# Patient Record
Sex: Female | Born: 1935 | Race: White | Hispanic: No
Health system: Southern US, Community
[De-identification: ages and names within clinical notes are randomized; demographics above are authoritative.]

---

## 2003-05-21 ENCOUNTER — Other Ambulatory Visit: Payer: Self-pay

## 2003-07-24 ENCOUNTER — Other Ambulatory Visit: Payer: Self-pay

## 2004-09-30 ENCOUNTER — Ambulatory Visit: Payer: Self-pay

## 2005-04-11 ENCOUNTER — Ambulatory Visit: Payer: Self-pay | Admitting: Internal Medicine

## 2005-04-27 ENCOUNTER — Other Ambulatory Visit: Payer: Self-pay

## 2005-05-05 ENCOUNTER — Inpatient Hospital Stay: Payer: Self-pay | Admitting: Unknown Physician Specialty

## 2005-05-11 ENCOUNTER — Emergency Department: Payer: Self-pay | Admitting: Emergency Medicine

## 2005-05-11 ENCOUNTER — Other Ambulatory Visit: Payer: Self-pay

## 2005-05-25 ENCOUNTER — Inpatient Hospital Stay: Payer: Self-pay | Admitting: Internal Medicine

## 2005-05-25 ENCOUNTER — Other Ambulatory Visit: Payer: Self-pay

## 2006-04-24 ENCOUNTER — Other Ambulatory Visit: Payer: Self-pay

## 2006-04-24 ENCOUNTER — Emergency Department: Payer: Self-pay | Admitting: Emergency Medicine

## 2007-06-11 ENCOUNTER — Ambulatory Visit: Payer: Self-pay

## 2007-07-01 ENCOUNTER — Other Ambulatory Visit: Payer: Self-pay

## 2007-07-01 ENCOUNTER — Inpatient Hospital Stay: Payer: Self-pay | Admitting: Internal Medicine

## 2007-07-05 ENCOUNTER — Other Ambulatory Visit: Payer: Self-pay

## 2007-09-22 ENCOUNTER — Emergency Department: Payer: Self-pay | Admitting: Emergency Medicine

## 2007-09-26 ENCOUNTER — Inpatient Hospital Stay: Payer: Self-pay | Admitting: Internal Medicine

## 2007-09-29 ENCOUNTER — Emergency Department: Payer: Self-pay | Admitting: Unknown Physician Specialty

## 2007-11-29 ENCOUNTER — Inpatient Hospital Stay: Payer: Self-pay | Admitting: Internal Medicine

## 2009-03-08 IMAGING — CR DG CHEST 1V PORT
1 series · 1 of 1 positions shown · non-contrast
Comparison: none

REASON FOR EXAM: COUGH
COMMENTS:

[view not recorded]
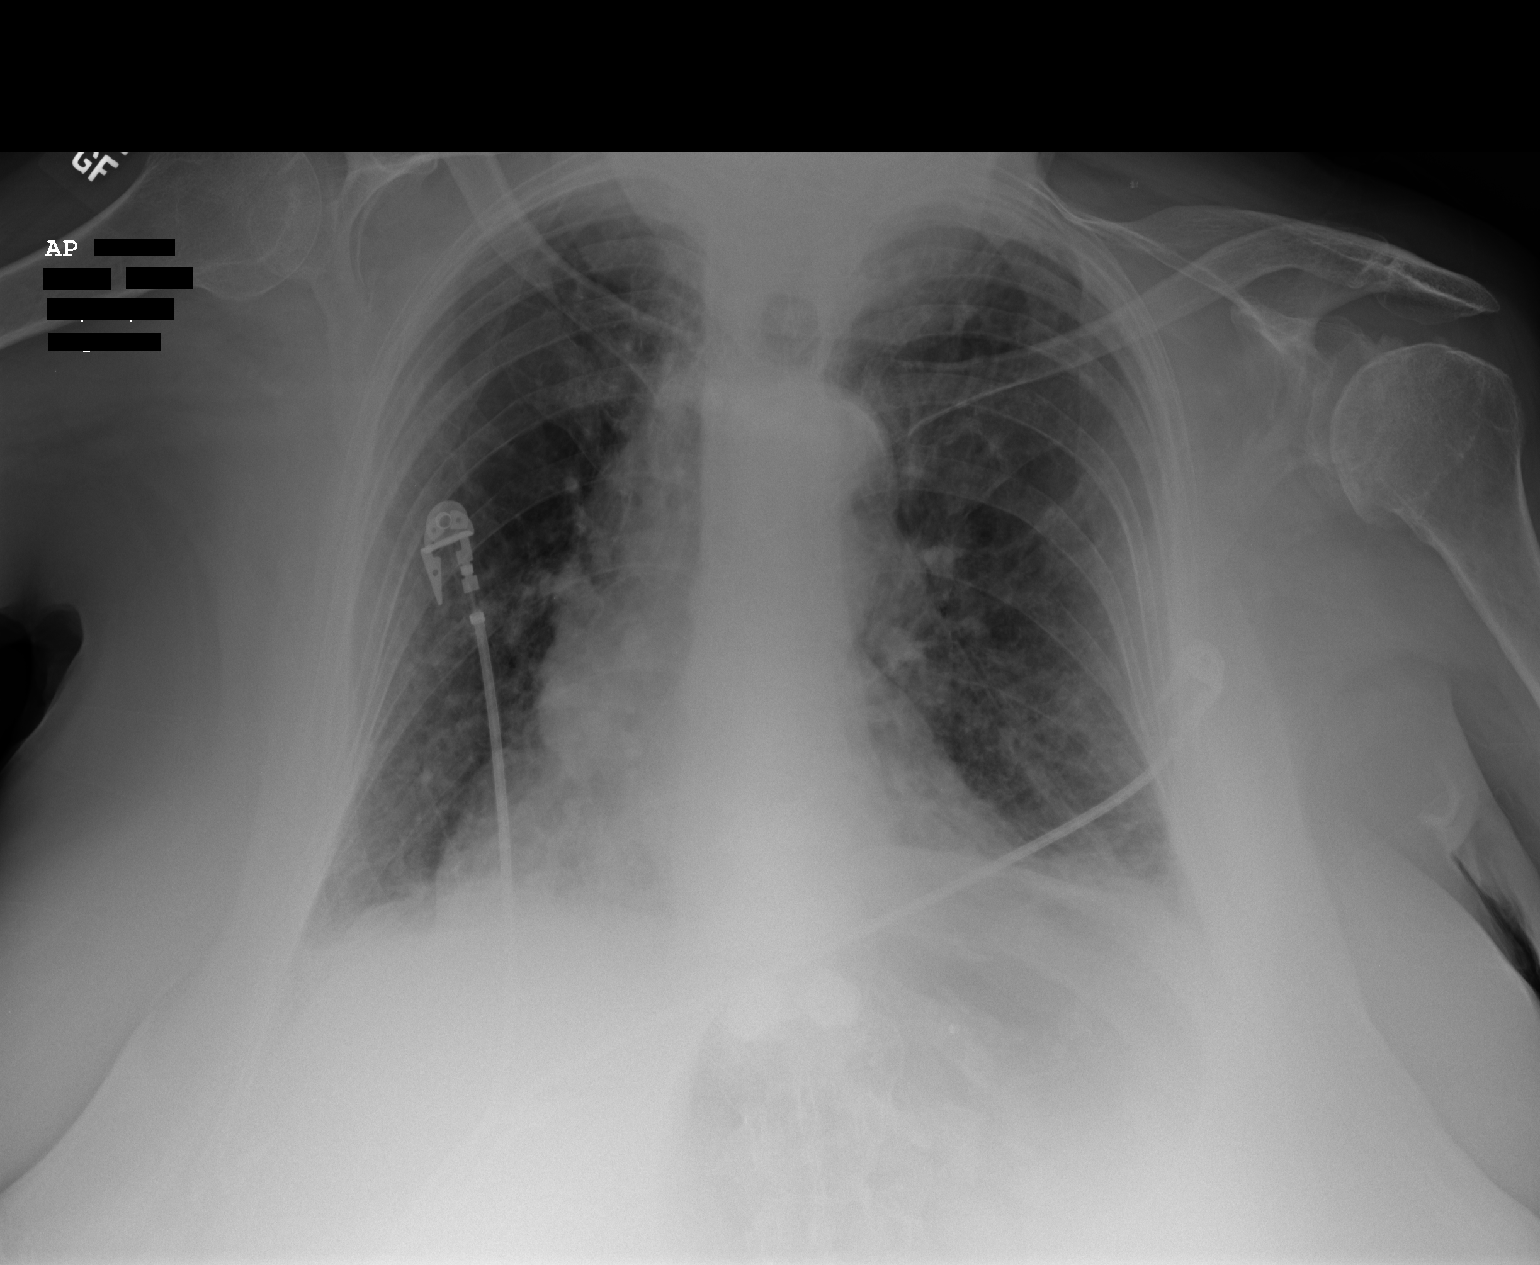

[1 of 1 positions shown; findings below may reference images not displayed]

PROCEDURE:     DXR - DXR PORTABLE CHEST SINGLE VIEW  - July 01, 2007  [DATE]

RESULT:     Comparison is made to study 24 April, 2006.

The lungs are adequately inflated. The interstitial markings are mildly
prominent but not significantly changed. The cardiac silhouette remains
enlarged. The pulmonary vascularity is not engorged. The bones are
osteopenic.
IMPRESSION: There are findings which may reflect an element of
underlying low grade CHF. I do not see evidence of pneumonia. There is no
significant interval change since the study [DATE].

## 2009-05-30 IMAGING — CR DG ABDOMEN 3V
1 series · 5 of 5 positions shown · non-contrast
Comparison: none

REASON FOR EXAM: Nausea, vomiting
COMMENTS:

[Series 1: view not recorded · 0.17mm/px · 5 of 5 slices shown]
[im 1/5]
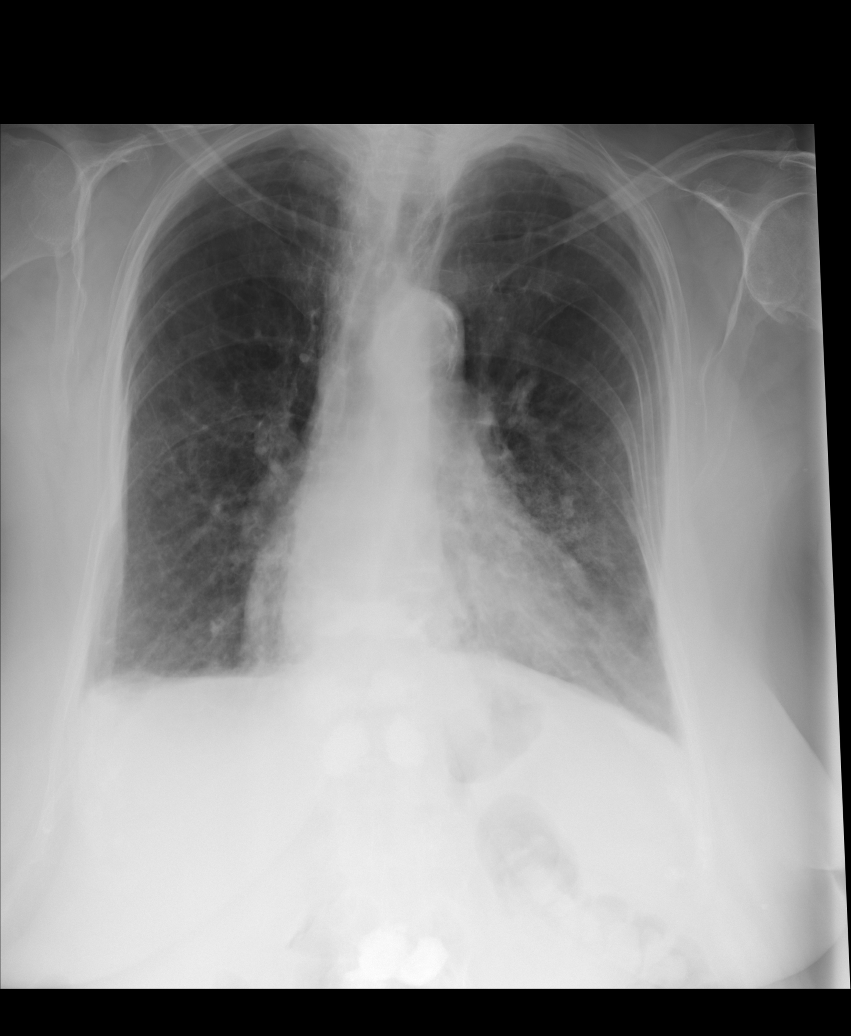
[im 2/5]
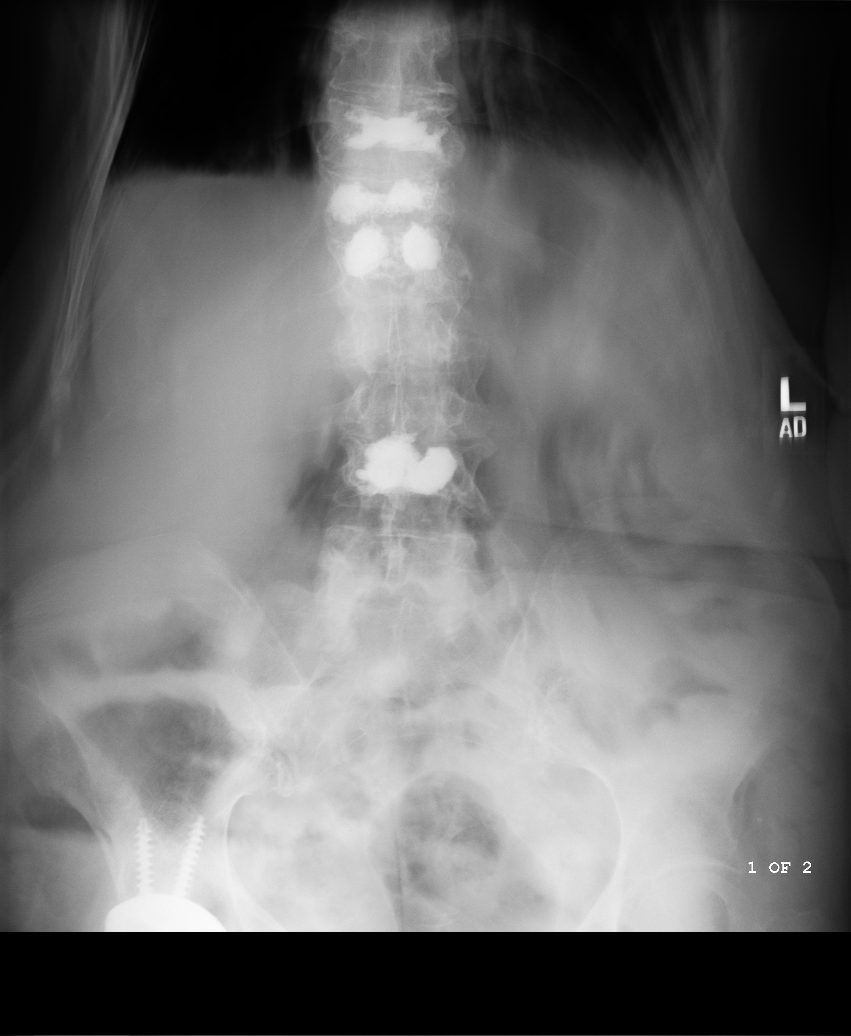
[im 3/5]
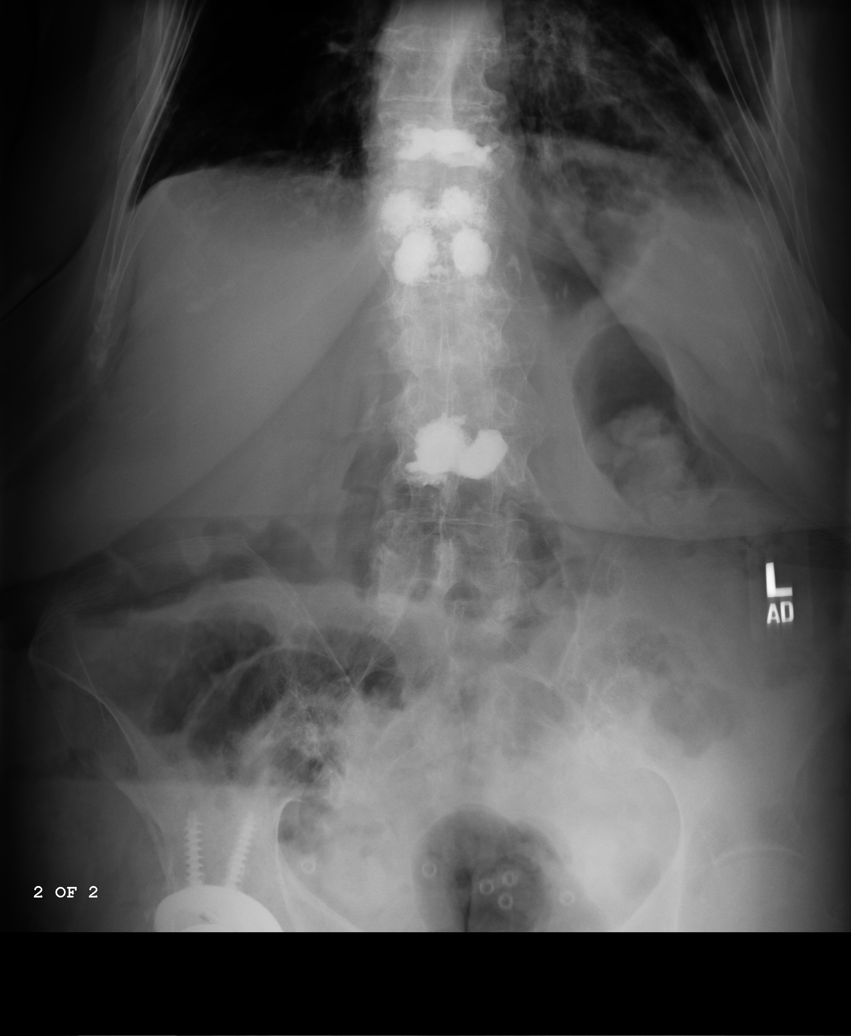
[im 4/5]
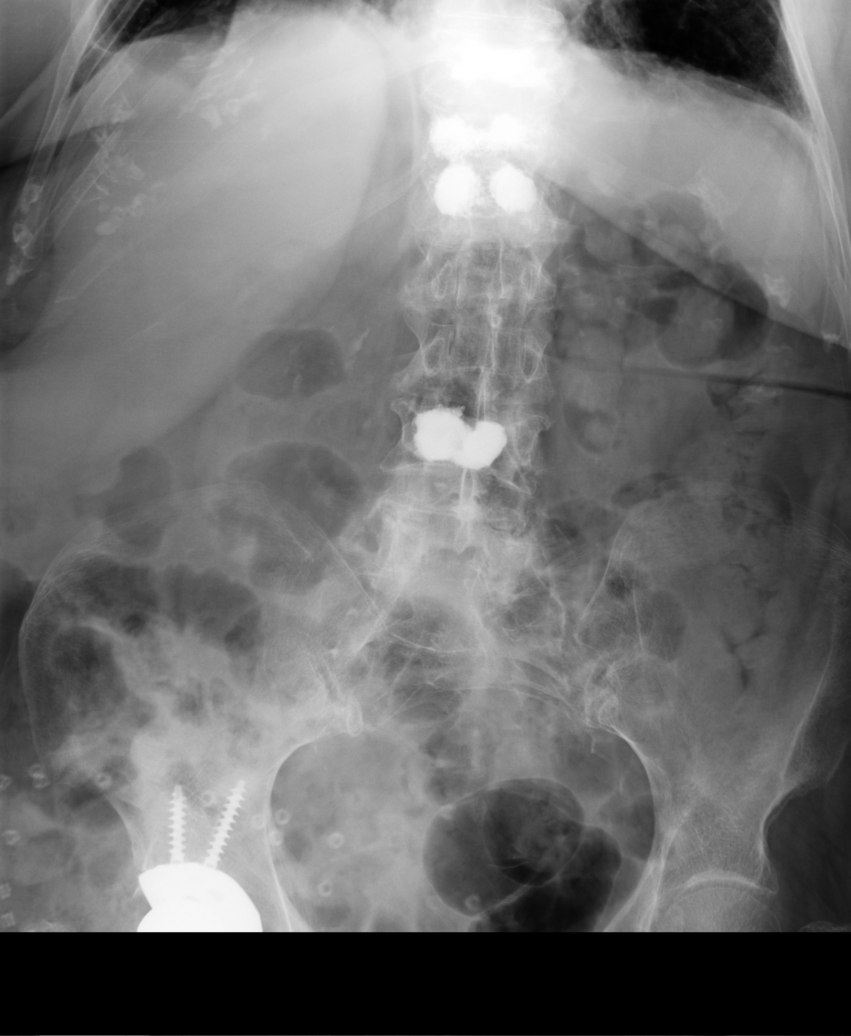
[im 5/5]
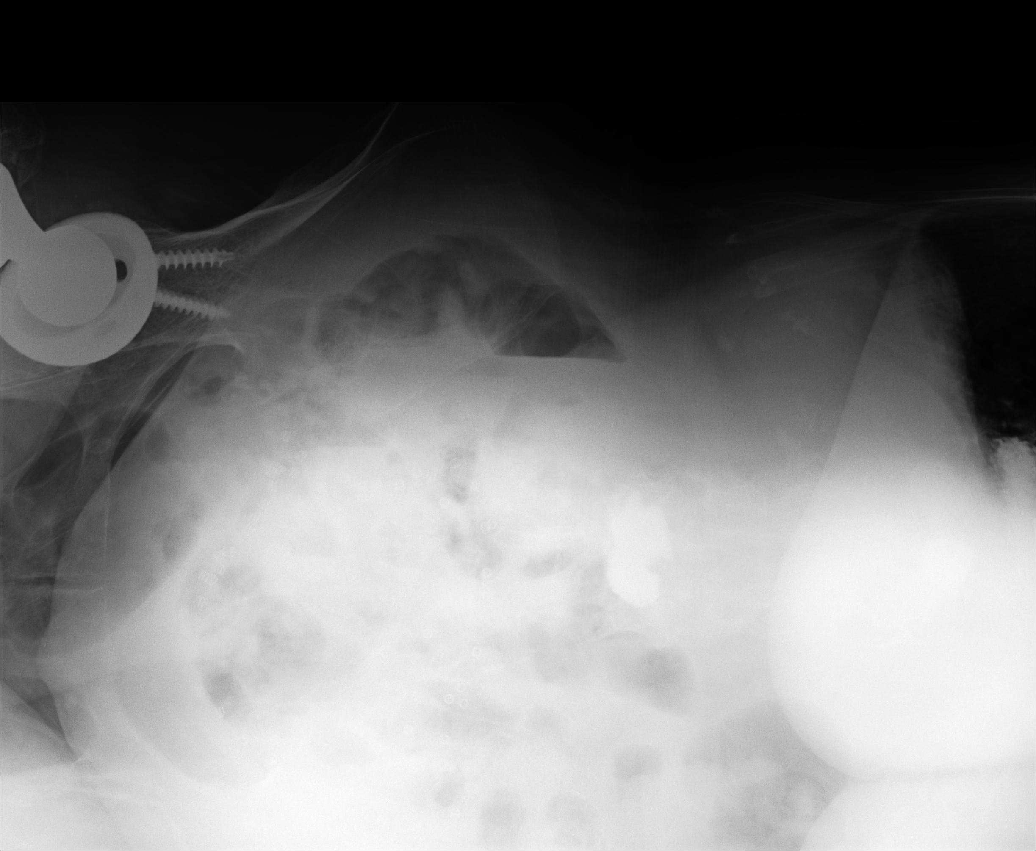

[5 of 5 positions shown; findings below may reference images not displayed]

PROCEDURE:     DXR - DXR ABDOMEN 3-WAY (INCL PA CXR)  - September 22, 2007  [DATE]

RESULT:     The heart is borderline to mildly enlarged. The lungs are
hyperinflated consistent with COPD. There is no infiltrate or effusion.

Multilevel vertebroplasty changes are present. The bowel gas pattern shows
some distended loops of small bowel with air/fluid levels. These are
predominately in the lower abdomen. There is air and stool scattered through
the colon to the rectosigmoid region. RIGHT hip arthroplasty changes are
present. The bones are osteopenic. No free air is evident.
IMPRESSION: 1.     Nonspecific bowel gas pattern. The changes could be secondary to a
partial obstruction or ileus.
2.     There are osteopenic changes with multilevel vertebroplasty changes.
3.     There is COPD with cardiomegaly and atherosclerotic calcification.

## 2009-06-03 IMAGING — CT CT ABD-PELV W/O CM
1 of 2 series · 15 of 32 positions shown, 19 images · non-contrast
Comparison: none

REASON FOR EXAM: (1) abdominal pain; (2) pain
COMMENTS:

[Series 2: abdomen · axial · 0.80mm/px · z∈[-354,+26]mm · 15 of 84 slices shown, 19 images]
[im 4/84  soft-tissue]
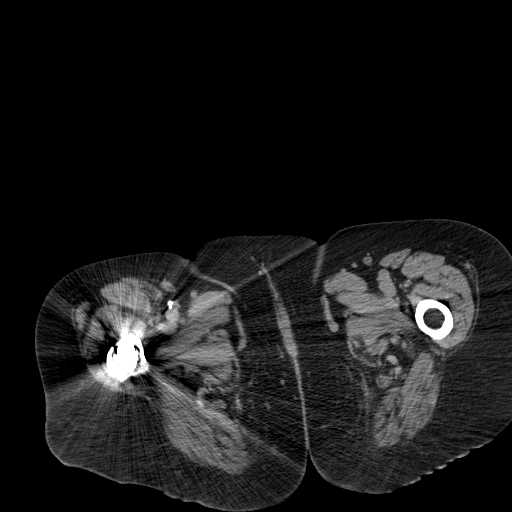
[im 4/84  bone]
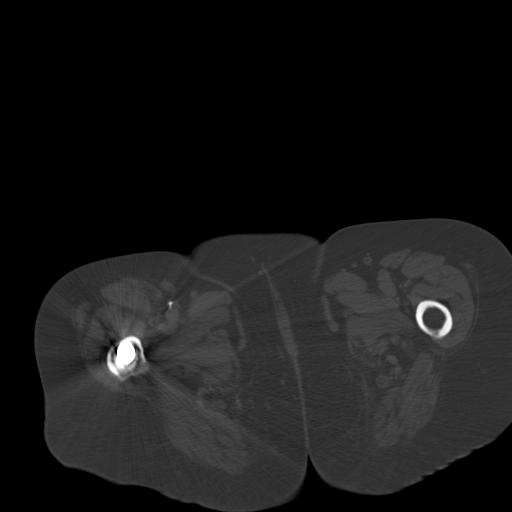
[im 10/84  soft-tissue]
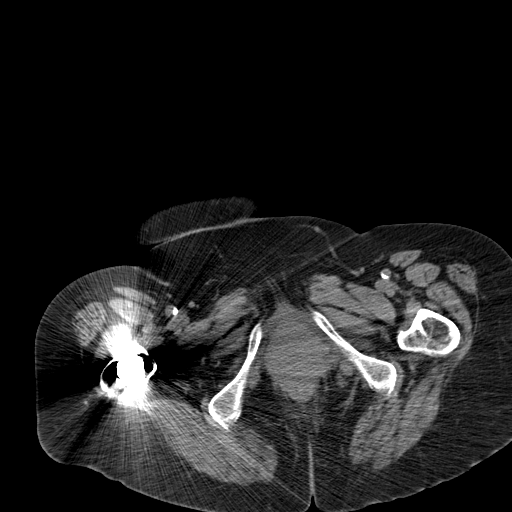
[im 16/84  soft-tissue]
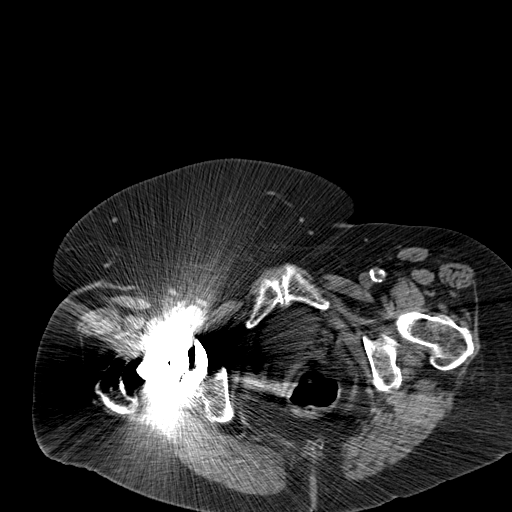
[im 26/84  soft-tissue]
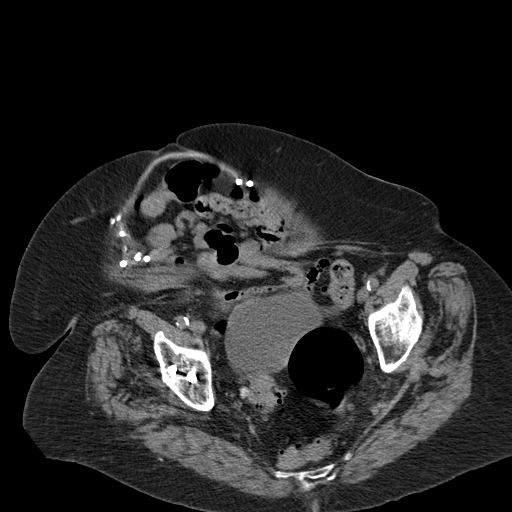
[im 32/84  soft-tissue]
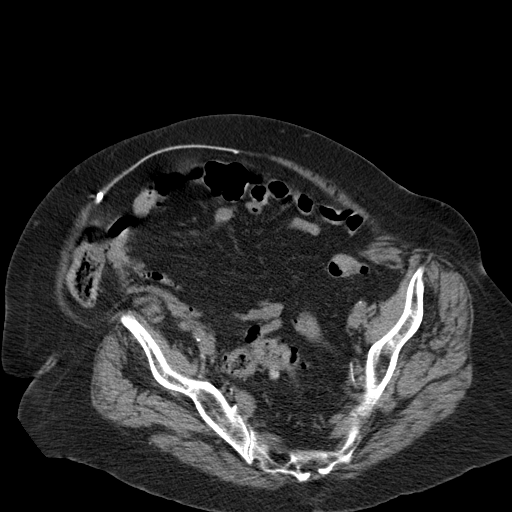
[im 39/84  soft-tissue]
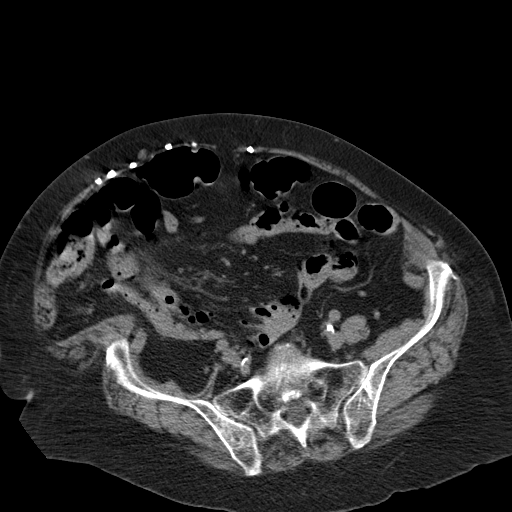
[im 45/84  soft-tissue]
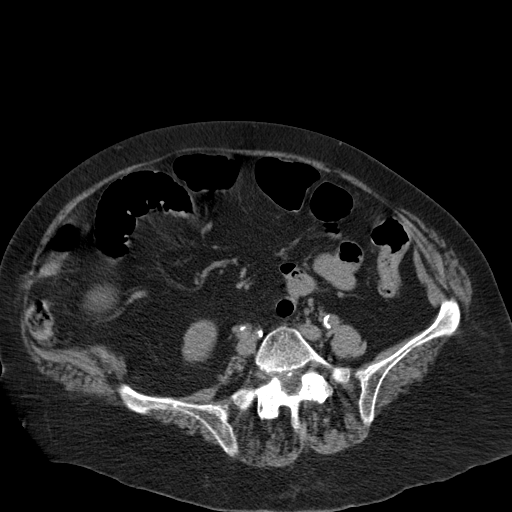
[im 48/84  soft-tissue]
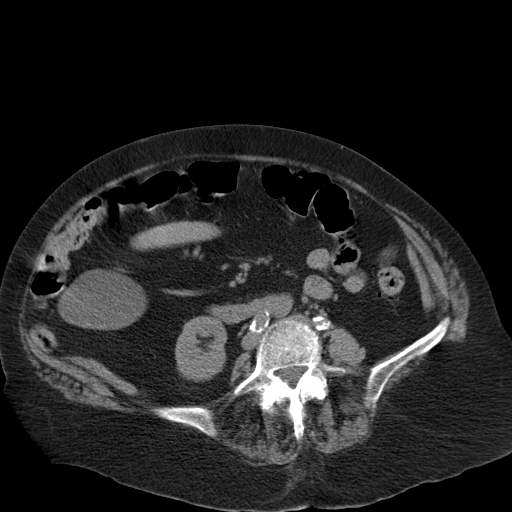
[im 55/84  soft-tissue]
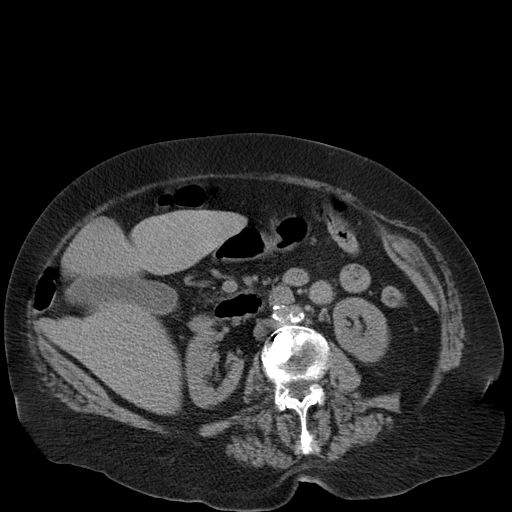
[im 55/84  bone]
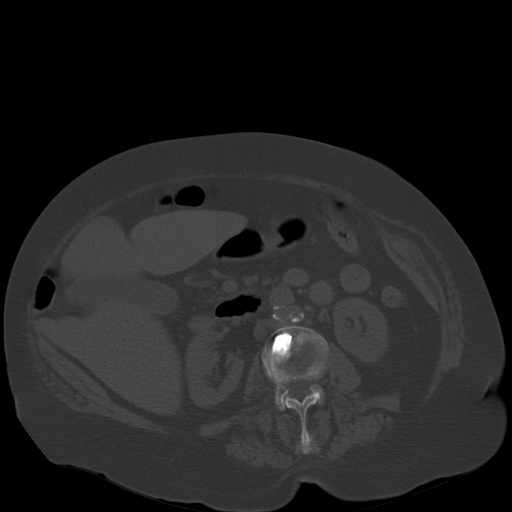
[im 61/84  soft-tissue]
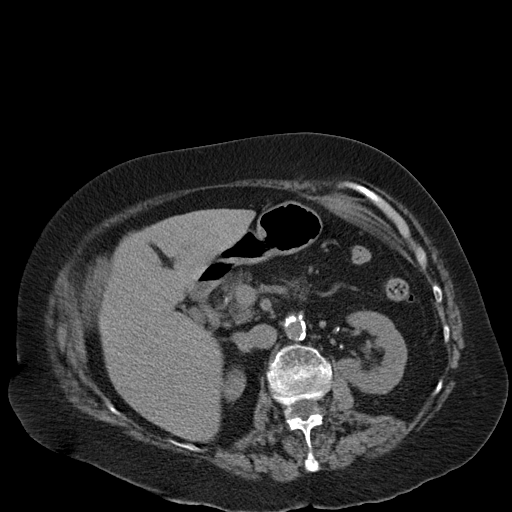
[im 68/84  soft-tissue]
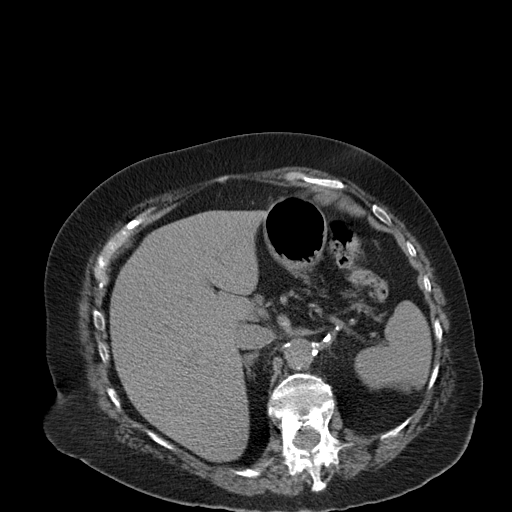
[im 71/84  lung]
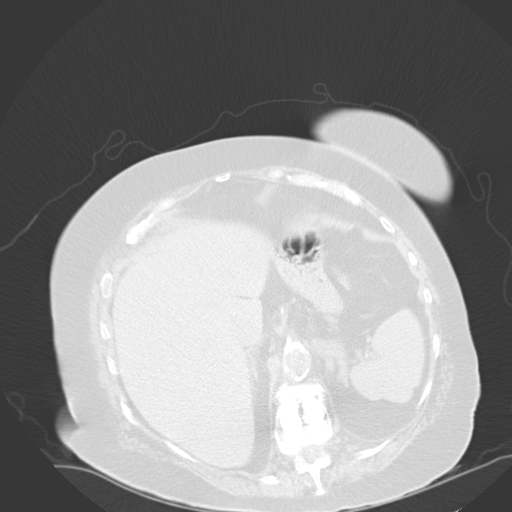
[im 74/84  soft-tissue]
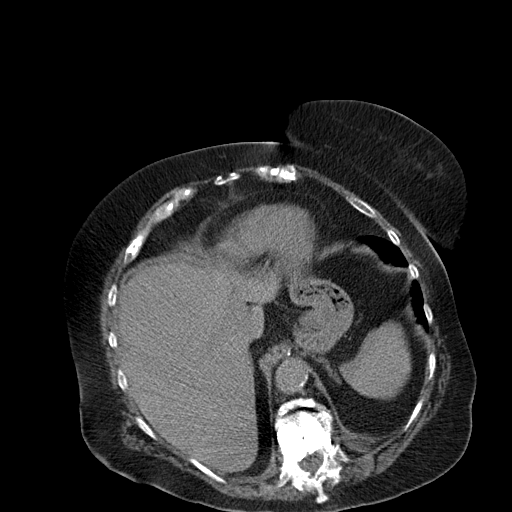
[im 74/84  lung]
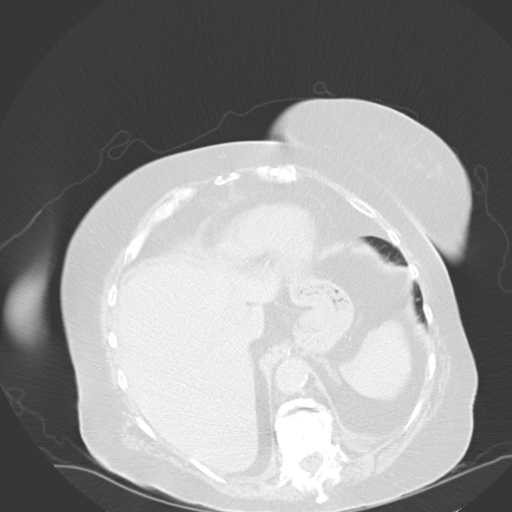
[im 77/84  lung]
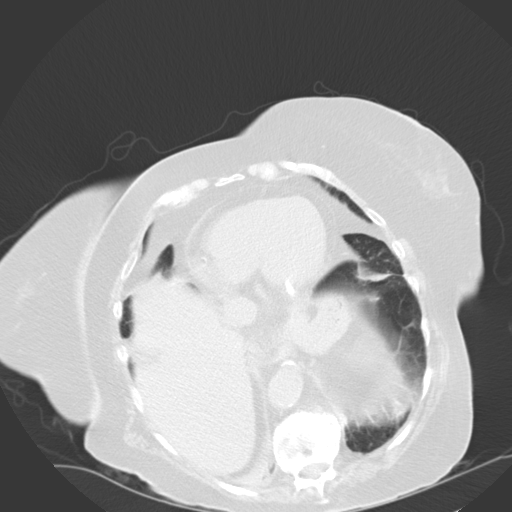
[im 80/84  soft-tissue]
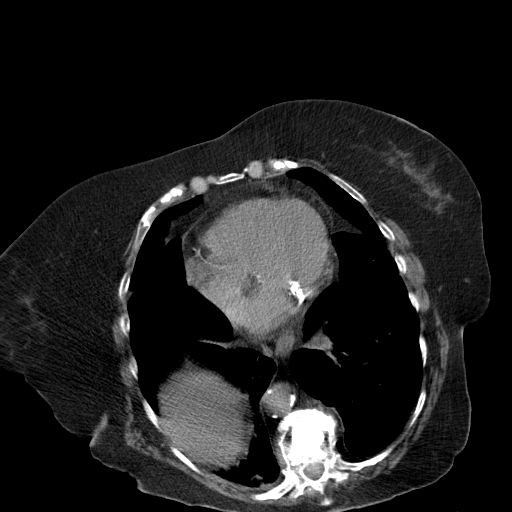
[im 80/84  lung]
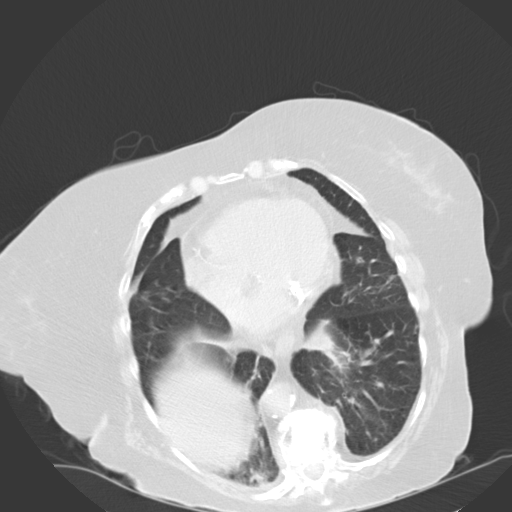

[15 of 32 positions shown; findings below may reference images not displayed]

PROCEDURE:     CT  - CT ABDOMEN AND PELVIS W[DATE]  [DATE]

RESULT:     The patient is being evaluated for nausea and vomiting and
possible small bowel obstruction. The patient received no oral or IV
contrast as requested. Comparison is made to a CT scan of the abdomen dated
03 July, 2007.

The scout images reveal moderate amounts of gas within the colon. The axial
images do not reveal evidence of an obstructive bowel gas pattern. The small
bowel loops exhibit no acute abnormality.

The liver exhibits normal density with no evidence of a mass or ductal
dilation. The spleen, nondistended stomach, and adrenal glands exhibit no
acute abnormality. The pancreas exhibits fatty infiltration. There are faint
calcifications layering within the gallbladder consistent with sub mm
stones. The caliber of the abdominal aorta is normal. The kidneys exhibit no
evidence of obstruction. The patient has undergone prior kyphoplasty
procedures. There is no evidence of ascites. The sigmoid colon and urinary
bladder are grossly normal. Metallic beam hardening artifact limits the
study. There is minimal atelectasis at the RIGHT lung base posteriorly.
IMPRESSION: 1. I do not see evidence of acute bowel obstruction. There is moderate
gaseous distention of the colon that does not appear abnormal.
2. I cannot exclude tiny calcified gallstones.
3. There is a lower abdominal ventral hernia which is not a new finding and
which is broadly necked, which does contain some loops of small and large
bowel
4. I see no acute abnormality elsewhere within the abdomen or pelvis.

## 2009-06-17 ENCOUNTER — Inpatient Hospital Stay: Payer: Self-pay | Admitting: Rheumatology

## 2010-05-11 ENCOUNTER — Ambulatory Visit: Payer: Self-pay

## 2011-07-11 ENCOUNTER — Ambulatory Visit: Payer: Self-pay | Admitting: Internal Medicine

## 2011-10-25 ENCOUNTER — Ambulatory Visit: Payer: Self-pay | Admitting: Internal Medicine

## 2012-05-27 ENCOUNTER — Ambulatory Visit: Payer: Self-pay | Admitting: Internal Medicine

## 2012-06-11 LAB — BASIC METABOLIC PANEL
Anion Gap: 7 (ref 7–16)
BUN: 56 mg/dL — ABNORMAL HIGH (ref 7–18)
Calcium, Total: 9.2 mg/dL (ref 8.5–10.1)
Chloride: 102 mmol/L (ref 98–107)
Co2: 30 mmol/L (ref 21–32)
Creatinine: 0.99 mg/dL (ref 0.60–1.30)
EGFR (African American): 59 — ABNORMAL LOW
Glucose: 143 mg/dL — ABNORMAL HIGH (ref 65–99)
Sodium: 139 mmol/L (ref 136–145)

## 2012-06-11 LAB — CBC
MCH: 29.4 pg (ref 26.0–34.0)
MCHC: 31.8 g/dL — ABNORMAL LOW (ref 32.0–36.0)
MCV: 93 fL (ref 80–100)
Platelet: 307 10*3/uL (ref 150–440)
RDW: 17.8 % — ABNORMAL HIGH (ref 11.5–14.5)

## 2012-06-12 ENCOUNTER — Observation Stay: Payer: Self-pay | Admitting: Internal Medicine

## 2012-06-12 LAB — URINALYSIS, COMPLETE
Bacteria: NONE SEEN
Blood: NEGATIVE
Nitrite: NEGATIVE
Ph: 7 (ref 4.5–8.0)
Protein: NEGATIVE
Specific Gravity: 1.012 (ref 1.003–1.030)
Squamous Epithelial: 2

## 2012-06-27 ENCOUNTER — Ambulatory Visit: Payer: Self-pay | Admitting: Internal Medicine

## 2014-01-25 DEATH — deceased

## 2014-10-14 NOTE — H&P (Signed)
PATIENT NAME:  Baltazar NajjarBRAXTON, Vergie L MR#:  [id number] DATE OF BIRTH:  05/14/1923  DATE OF ADMISSION:  06/12/2012  PRIMARY CARE PHYSICIAN: Dr. Dan HumphreysWalker  CHIEF COMPLAINT: A fall.   HISTORY OF PRESENT ILLNESS: This is a very pleasant 81 year old female with history of atrial fibrillation, reflux esophagitis, hypertension, who presents with above complaint. The patient's HPI was taken from the daughter, who is at bedside. Apparently, sometime between 1:30 and 5:45 yesterday, the patient had a fall. She said that she was walking to her bedroom.  She left her walker in the hallway, got to the bedroom, and lost her balance and fell. Her daughter found her about 5:54 when she was bringing supper to her. She was alert and oriented, and was not complaining of any pain. The daughter actually took her to the chair. She noticed a slight left facial droop and left hand weakness so EMS was called for further evaluation. The patient has been here since 7:30 last night. The daughter did not want any further workup to work up for a stroke. She was noted to be in A. fib., RVR. She has been on aspirin in the past, however, has had nosebleeds on aspirin, but the daughter is agreeable for starting aspirin now.  Her left facial droop and her left-sided weakness has improved. Again, the daughter does not want workup for a stroke/TIA.  As far as her atrial fibrillation, she was given some medications for her RVR and her heart rate apparently decreased and her blood pressure dropped. Her heart rate remains to be tachycardic, however, her blood pressure has improved. Palliative care came to see the patient while in the ER as well.  Current plan for now is to admit the patient observation, no workup for rule out TIA/CVA, however, continue telemetry for atrial fibrillation, obtain a PT, OT consult.   REVIEW OF SYSTEMS: CONSTITUTIONAL: The patient denies any fevers, fatigue, weakness.  EYES: No blurry or double vision. No glaucoma.  ENT: She  has hearing loss. No snoring. __________.  No cough, wheezing. She does have COPD.   CARDIOVASCULAR: She denies any chest pain, palpitations, orthopnea, or syncope.  GASTROINTESTINAL: No nausea, vomiting, or diarrhea. No abdominal pain.  GENITOURINARY:  No dysuria or hematuria.   ENDOCRINE:  No polyuria, polydipsia, increased sweating, She does have cold intolerance.   SKIN:  She bruises easily.  She has several bruises and skin tears.  MUSCULOSKELETAL:  She has limited activity.  She walks with a walker.  NEURO:  No history of CVA or TIA.   PSYCH: No anxiety. She was on hospice about 7 years ago and since that time she has been on chronic narcotics.   PAST MEDICAL HISTORY:   1.  COPD with home oxygen.  2.  Hypertension.  3.  Renal insufficiency. 4.  Mild sleep apnea.  5.  Osteoarthritis. 6.  Reflux esophagitis treated with Nissen fundoplication.   7.  Atrial fibrillation, recently diagnosed in May 2013.    MEDICATIONS: 1.  Advair Diskus 100/50 b.i.d.  2.  Allopurinol 300 mg at bedtime.  3.  Docusate 100 mg daily.  4.  Lasix 40 mg daily.  5.  Metolazone 2.5 mg on Tuesday, Thursday, and Saturday.   6.  __________ 25 t.i.d.  7.  Morphine 15 mg b.i.d.  8.  Nystatin t.i.d. p.r.n.  9.  KCl 10 mEq, 2 tablets at 12 o'clock.   10.  Premarin 0.625 vaginal as needed.  11.  Senna 2 tablets b.i.d.  12.  Spiriva 18 mcg daily.  13.  Synthroid 75 mcg daily.   ALLERGIES:  1.  ACE INHIBITORS, HIVES.  2.  DEMEROL.  3.  MICARDIS AND MIRALAX CAUSED HIVES.   SOCIAL HISTORY:  The patient lives with beside her daughter. Her daughter provides meals for her and helps her with bathing. No tobacco, alcohol, or drug use.    FAMILY HISTORY:  Positive for cancer.    PHYSICAL EXAMINATION: VITAL SIGNS:  Temperature 98.1, pulse 110 to 114, respirations 20, blood pressure 109/79, and 94% on 2 liters.  GENERAL: The patient is alert, oriented x3, not in acute distress.  HEENT:  Head is atraumatic.  Pupils  are round and reactive.  Sclerae are anicteric.  Mucous membranes are moist.  Oropharynx is clear.   NECK: Supple without JVD, carotid bruit, or enlarged thyroid.  CARDIOVASCULAR: Tachycardia, irregularly irregular with a 2 out of 6 systolic ejection murmur heard best at the left sternal border. There is no radiation. PMI is not displaced.  LUNGS: Clear to auscultation without crackles, rales, rhonchi, or wheezing. Normal percussion.  ABDOMEN: Bowel sounds are positive. Nontender, nondistended. No hepatosplenomegaly.   EXTREMITIES:  No clubbing, cyanosis, or edema.   SKIN:  She has numerous bruising all over her body including her arms and legs. She had skin tears of her left arm which are wrapped.  I did not observe these skin tears.  NEUROLOGICAL:  Cranial nerves II through XII are intact. There are no focal deficits. Sensation is intact bilaterally and symmetrically. As far as strength, she has 4+ out of 5 in the left upper extremity, all others are 5 out of 5.   LABORATORY:  1.  Sodium 139, potassium 4.3, chloride 102, bicarbonate 30, BUN 56, creatinine 0.99, glucose 143. White blood cells 14.6, hemoglobin 9.8, hematocrit 31, platelets 307.  2.  Left shoulder x-ray shows no acute fracture.  3.  Wrist left shows no evidence of acute displaced fracture. She has osteopenia. 4.  CT of the head shows no acute abnormality.  6.  EKG shows atrial fibrillation, RVR, no ST elevations.   ASSESSMENT AND PLAN: This is an 81 year old female with history of atrial fibrillation who presented after a mechanical fall, subsequently had some left facial droop and left weakness which is now resolving.  1.  Atrial fibrillation and rapid ventricular response. At this time, we will start aspirin 325 mg daily. No further workup for atrial fibrillation. We will continue her metoprolol, monitor her on telemetry.  2.  Left facial droop, likely a transient ischemic attack. The patient's symptoms have subsided.  She has  some mild weakness on the left side.  I will order PT/OT consult. Family does not want any further workup for this 81 year old female. Will continue aspirin, PT, OT, and telemetry as well as neuro checks q.4 hours.  3.  Chronic obstructive pulmonary disease, which seems to be stable at this time. Continue inhalers.  4.  Hypertension. Continue metoprolol.   CODE STATUS: The patient is a DO NOT RESUSCITATE status, palliative care.  PT/OT consult has been placed.    TIME SPENT:  About 40 minutes.     ____________________________ Janyth Contes. Juliene Pina, MD spm:cs D: 06/12/2012 12:58:00 ET T: 06/12/2012 14:11:23 ET JOB#: [id number]  cc: Nirel Babler P. Juliene Pina, MD, <Dictator> Dr. Christian Mate P Dayson Aboud MD ELECTRONICALLY SIGNED 06/12/2012 16:24
